# Patient Record
Sex: Female | Born: 2000 | Race: White | Hispanic: No | Marital: Single | Smoking: Never smoker
Health system: Southern US, Community
[De-identification: ages and names within clinical notes are randomized; demographics above are authoritative.]

## PROBLEM LIST (undated history)

## (undated) DIAGNOSIS — C801 Malignant (primary) neoplasm, unspecified: Secondary | ICD-10-CM

## (undated) HISTORY — DX: Malignant (primary) neoplasm, unspecified: C80.1

---
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
## 2021-02-19 ENCOUNTER — Ambulatory Visit (INDEPENDENT_AMBULATORY_CARE_PROVIDER_SITE_OTHER): Payer: No Typology Code available for payment source | Admitting: Family Medicine

## 2021-02-19 ENCOUNTER — Encounter: Payer: Self-pay | Admitting: Family Medicine

## 2021-02-19 ENCOUNTER — Other Ambulatory Visit: Payer: Self-pay

## 2021-02-19 VITALS — BP 124/66 | HR 75 | Ht 66.0 in | Wt 190.6 lb

## 2021-02-19 DIAGNOSIS — Z7689 Persons encountering health services in other specified circumstances: Secondary | ICD-10-CM

## 2021-03-11 ENCOUNTER — Encounter: Payer: Self-pay | Admitting: Family Medicine

## 2021-03-11 DIAGNOSIS — Z7689 Persons encountering health services in other specified circumstances: Secondary | ICD-10-CM | POA: Insufficient documentation

## 2021-03-11 NOTE — Assessment & Plan Note (Signed)
Patient in to establish care today, no acute or chronic problems. Patient is working on diet and weight.

## 2021-03-11 NOTE — Progress Notes (Signed)
Established Patient Office Visit  SUBJECTIVE:  Subjective  Patient ID: Stephanie Arnold, female    DOB: [birth year]  Age: 20 y.o. MRN: [id number]  CC:  Chief Complaint  Patient presents with   New Patient (Initial Visit)    HPI Stephanie Arnold is a 20 y.o. female presenting today for     Past Medical History:  Diagnosis Date   Cancer [facility])     History reviewed. No pertinent surgical history.  Family History  Problem Relation Age of Onset   Hypertension Father    Heart disease Father    Diabetes Father     Social History   Socioeconomic History   Marital status: Single    Spouse name: Not on file   Number of children: Not on file   Years of education: Not on file   Highest education level: Not on file  Occupational History   Not on file  Tobacco Use   Smoking status: Never   Smokeless tobacco: Never  Vaping Use   Vaping Use: Never used  Substance and Sexual Activity   Alcohol use: Never   Drug use: Never   Sexual activity: Not Currently  Other Topics Concern   Not on file  Social History Narrative   Not on file   Social Determinants of Health   Financial Resource Strain: Not on file  Food Insecurity: Not on file  Transportation Needs: Not on file  Physical Activity: Not on file  Stress: Not on file  Social Connections: Not on file  Intimate Partner Violence: Not on file     Current Outpatient Medications:    amoxicillin (AMOXIL) 875 MG tablet, Take 1 tablet by mouth every 12 (twelve) hours., Disp: , Rfl:    No Known Allergies  ROS Review of Systems  Constitutional: Negative.   HENT: Negative.    Respiratory: Negative.    Cardiovascular: Negative.   Genitourinary: Negative.   Musculoskeletal: Negative.   Psychiatric/Behavioral: Negative.      OBJECTIVE:    Physical Exam Constitutional:      Appearance: She is obese.  HENT:     Right Ear: Tympanic membrane normal.     Nose: Nose normal.  Cardiovascular:     Rate and Rhythm:  Normal rate and regular rhythm.  Musculoskeletal:     Cervical back: Normal range of motion.  Skin:    General: Skin is warm.  Psychiatric:        Mood and Affect: Mood normal.    BP 124/66   Pulse 75   Ht _0  (1.676 m)   Wt 190 lb 9.6 oz (86.5 kg)   BMI 30.76 kg/m  Wt Readings from Last 3 Encounters:  02/19/21 190 lb 9.6 oz (86.5 kg) (96 %, Z= 1.79)*   * Growth percentiles are based on CDC (Girls, 2-20 Years) data.    Health Maintenance Due  Topic Date Due   COVID-19 Vaccine (1) Never done   Pneumococcal Vaccine 15-92 Years old (1 - PCV) Never done   HPV VACCINES (1 - 2-dose series) Never done   HIV Screening  Never done   Hepatitis C Screening  Never done       Topic Date Due   HPV VACCINES (1 - 2-dose series) Never done    No flowsheet data found. No flowsheet data found.  No results found for: TSH No results found for: ALBUMIN, ANIONGAP, EGFR, GFR No results found for: CHOL, HDL, LDLCALC, CHOLHDL No results found for: TRIG  No results found for: HGBA1C    ASSESSMENT & PLAN:   Problem List Items Addressed This Visit       Other   Establishing care with new doctor, encounter for - Primary    Patient in to establish care today, no acute or chronic problems. Patient is working on diet and weight.         No orders of the defined types were placed in this encounter.     Follow-up: No follow-ups on file.    Beckie Salts, [place] [address] [place], [place] [address]

## 2021-05-18 ENCOUNTER — Ambulatory Visit (LOCAL_COMMUNITY_HEALTH_CENTER): Payer: Self-pay

## 2021-05-18 ENCOUNTER — Other Ambulatory Visit: Payer: Self-pay

## 2021-05-18 DIAGNOSIS — Z7185 Encounter for immunization safety counseling: Secondary | ICD-10-CM

## 2021-05-18 NOTE — Progress Notes (Signed)
In Nurse Clinic with adult brother for vaccines as needed for [facility]. Upon [facility] review, pt is up to date on required vaccines. Declines HPV today.  [facility] copy given. Josie Saunders, RN

## 2021-07-20 ENCOUNTER — Ambulatory Visit: Payer: Self-pay | Admitting: Internal Medicine

## 2022-04-15 ENCOUNTER — Ambulatory Visit: Payer: BC Managed Care – PPO | Admitting: Nurse Practitioner

## 2022-04-15 ENCOUNTER — Encounter: Payer: Self-pay | Admitting: Nurse Practitioner

## 2022-04-15 VITALS — BP 125/86 | HR 95 | Ht 66.0 in | Wt 193.7 lb

## 2022-04-15 DIAGNOSIS — R5383 Other fatigue: Secondary | ICD-10-CM | POA: Diagnosis not present

## 2022-04-15 NOTE — Progress Notes (Signed)
Established Patient Office Visit  SUBJECTIVE:  Subjective  Patient ID: Stephanie Arnold, female    DOB: 2001/04/11  Age: 21 y.o. MRN: [id number]  CC:  Chief Complaint  Patient presents with   Fatigue    Patient reports she is very fatigued, she reports she never feels like she can get enough sleep, bruising easily, and just does not feel good in general.    HPI JAELIN DEVINCENTIS is in the office with complaint of being fatigued from more than a month. She works at [employer]. She exercise occasionally.    HPI   Past Medical History:  Diagnosis Date   Cancer [facility])     History reviewed. No pertinent surgical history.  Family History  Problem Relation Age of Onset   Hypertension Father    Heart disease Father    Diabetes Father     Social History   Socioeconomic History   Marital status: Single    Spouse name: Not on file   Number of children: Not on file   Years of education: Not on file   Highest education level: Not on file  Occupational History   Not on file  Tobacco Use   Smoking status: Never   Smokeless tobacco: Never  Vaping Use   Vaping Use: Never used  Substance and Sexual Activity   Alcohol use: Never   Drug use: Never   Sexual activity: Not Currently  Other Topics Concern   Not on file  Social History Narrative   Not on file   Social Determinants of Health   Financial Resource Strain: Not on file  Food Insecurity: Not on file  Transportation Needs: Not on file  Physical Activity: Not on file  Stress: Not on file  Social Connections: Not on file  Intimate Partner Violence: Not on file    No current outpatient medications on file.   No Known Allergies  ROS Review of Systems  Constitutional:  Positive for appetite change (sometimes very hungry and some time not) and fatigue. Negative for fever.  HENT: Negative.    Respiratory:  Positive for chest tightness. Negative for shortness of breath.   Cardiovascular:   Negative for chest pain and palpitations.  Gastrointestinal: Negative.   Genitourinary: Negative.   Musculoskeletal: Negative.   Neurological:  Positive for headaches. Negative for dizziness and facial asymmetry.  Psychiatric/Behavioral:  Negative for agitation and behavioral problems. The patient is nervous/anxious.      OBJECTIVE:    Physical Exam Constitutional:      Appearance: Normal appearance. She is obese.  HENT:     Head: Normocephalic.     Right Ear: Tympanic membrane normal.     Left Ear: Tympanic membrane normal.     Nose: Nose normal.     Mouth/Throat:     Mouth: Mucous membranes are moist.     Pharynx: Oropharynx is clear.  Eyes:     Extraocular Movements: Extraocular movements intact.     Conjunctiva/sclera: Conjunctivae normal.     Pupils: Pupils are equal, round, and reactive to light.  Cardiovascular:     Rate and Rhythm: Normal rate and regular rhythm.     Pulses: Normal pulses.     Heart sounds: Normal heart sounds.  Pulmonary:     Effort: Pulmonary effort is normal. No respiratory distress.     Breath sounds: Normal breath sounds. No rhonchi.  Abdominal:     General: Bowel sounds are normal.     Palpations: Abdomen  is soft. There is no mass.     Tenderness: There is no abdominal tenderness.     Hernia: No hernia is present.  Musculoskeletal:        General: Normal range of motion.     Cervical back: Normal range of motion and neck supple. No tenderness.  Skin:    General: Skin is warm.     Capillary Refill: Capillary refill takes less than 2 seconds.  Neurological:     General: No focal deficit present.     Mental Status: She is alert and oriented to person, place, and time. Mental status is at baseline.  Psychiatric:        Mood and Affect: Mood normal.        Behavior: Behavior normal.        Thought Content: Thought content normal.        Judgment: Judgment normal.     BP 125/86   Pulse 95   Ht '5\' 6"'  (1.676 m)   Wt 193 lb 11.2 oz (87.9  kg)   BMI 31.26 kg/m  Wt Readings from Last 3 Encounters:  04/15/22 193 lb 11.2 oz (87.9 kg)  02/19/21 190 lb 9.6 oz (86.5 kg) (96 %, Z= 1.79)*   * Growth percentiles are based on CDC (Girls, 2-20 Years) data.    Health Maintenance Due  Topic Date Due   COVID-19 Vaccine (1) Never done   HPV VACCINES (1 - Risk 3-dose series) Never done   HIV Screening  Never done   Hepatitis C Screening  Never done   INFLUENZA VACCINE  04/12/2022       Topic Date Due   HPV VACCINES (1 - Risk 3-dose series) Never done       Latest Ref Rng & Units 04/15/2022   12:21 PM  CBC  WBC 3.8 - 10.8 Thousand/uL 5.5   Hemoglobin 11.7 - 15.5 g/dL 13.8   Hematocrit 35.0 - 45.0 % 41.2   Platelets 140 - 400 Thousand/uL 203       Latest Ref Rng & Units 04/15/2022   12:21 PM  CMP  Glucose 65 - 99 mg/dL 84   BUN 7 - 25 mg/dL 8   Creatinine 0.50 - 0.96 mg/dL 0.64   Sodium 135 - 146 mmol/L 138   Potassium 3.5 - 5.3 mmol/L 4.5   Chloride 98 - 110 mmol/L 106   CO2 20 - 32 mmol/L 23   Calcium 8.6 - 10.2 mg/dL 9.2   Total Protein 6.1 - 8.1 g/dL 7.8   Total Bilirubin 0.2 - 1.2 mg/dL 0.3   AST 10 - 30 U/L 19   ALT 6 - 29 U/L 17     Lab Results  Component Value Date   TSH 2.00 04/15/2022   Lab Results  Component Value Date   EGFR 130 04/15/2022   Lab Results  Component Value Date   CHOL 141 04/15/2022   HDL 58 04/15/2022   LDLCALC 60 04/15/2022   CHOLHDL 2.4 04/15/2022   Lab Results  Component Value Date   TRIG 149 04/15/2022   No results found for: "HGBA1C"    ASSESSMENT & PLAN:   Problem List Items Addressed This Visit       Other   Other fatigue - Primary    Advised patient to reduce stress by meditation and breathing exercises. Follow regular exercise routine and to eat healthy. Labs ordered.      Relevant Orders   CBC with Differential/Platelet (Completed)  COMPLETE METABOLIC PANEL WITH GFR (Completed)   Lipid panel (Completed)   TSH (Completed)   T3 (Completed)   T4  (Completed)   No orders of the defined types were placed in this encounter.     Follow-up: Return if symptoms worsen or fail to improve.    Theresia Lo, NP [facility] [address] [place],  [address]

## 2022-04-16 LAB — COMPLETE METABOLIC PANEL WITH GFR
AG Ratio: 1.4 (calc) (ref 1.0–2.5)
ALT: 17 U/L (ref 6–29)
AST: 19 U/L (ref 10–30)
Albumin: 4.6 g/dL (ref 3.6–5.1)
Alkaline phosphatase (APISO): 76 U/L (ref 31–125)
BUN: 8 mg/dL (ref 7–25)
CO2: 23 mmol/L (ref 20–32)
Calcium: 9.2 mg/dL (ref 8.6–10.2)
Chloride: 106 mmol/L (ref 98–110)
Creat: 0.64 mg/dL (ref 0.50–0.96)
Globulin: 3.2 g/dL (calc) (ref 1.9–3.7)
Glucose, Bld: 84 mg/dL (ref 65–99)
Potassium: 4.5 mmol/L (ref 3.5–5.3)
Sodium: 138 mmol/L (ref 135–146)
Total Bilirubin: 0.3 mg/dL (ref 0.2–1.2)
Total Protein: 7.8 g/dL (ref 6.1–8.1)
eGFR: 130 mL/min/{1.73_m2} (ref >=60)

## 2022-04-16 LAB — CBC WITH DIFFERENTIAL/PLATELET
Absolute Monocytes: 512 cells/uL (ref 200–950)
Basophils Absolute: 61 cells/uL (ref 0–200)
Basophils Relative: 1.1 %
Eosinophils Absolute: 253 cells/uL (ref 15–500)
Eosinophils Relative: 4.6 %
HCT: 41.2 % (ref 35.0–45.0)
Hemoglobin: 13.8 g/dL (ref 11.7–15.5)
Lymphs Abs: 1958 cells/uL (ref 850–3900)
MCH: 27.4 pg (ref 27.0–33.0)
MCHC: 33.5 g/dL (ref 32.0–36.0)
MCV: 81.9 fL (ref 80.0–100.0)
MPV: 11 fL (ref 7.5–12.5)
Monocytes Relative: 9.3 %
Neutro Abs: 2717 cells/uL (ref 1500–7800)
Neutrophils Relative %: 49.4 %
Platelets: 203 10*3/uL (ref 140–400)
RBC: 5.03 10*6/uL (ref 3.80–5.10)
RDW: 15.5 % — ABNORMAL HIGH (ref 11.0–15.0)
Total Lymphocyte: 35.6 %
WBC: 5.5 10*3/uL (ref 3.8–10.8)

## 2022-04-16 LAB — LIPID PANEL
Cholesterol: 141 mg/dL (ref <200)
HDL: 58 mg/dL (ref >=50)
LDL Cholesterol (Calc): 60 mg/dL (calc)
Non-HDL Cholesterol (Calc): 83 mg/dL (calc) (ref <130)
Total CHOL/HDL Ratio: 2.4 (calc) (ref <5.0)
Triglycerides: 149 mg/dL (ref <150)

## 2022-04-16 LAB — T3: T3, Total: 157 ng/dL (ref 86–192)

## 2022-04-16 LAB — TSH: TSH: 2 mIU/L

## 2022-04-16 LAB — T4: T4, Total: 7.8 ug/dL (ref 5.3–11.7)

## 2022-04-27 DIAGNOSIS — R5383 Other fatigue: Secondary | ICD-10-CM | POA: Insufficient documentation

## 2022-04-27 NOTE — Assessment & Plan Note (Addendum)
Advised patient to reduce stress by meditation and breathing exercises. Follow regular exercise routine and to eat healthy. Labs ordered.
